# Patient Record
Sex: Female | Born: 1945 | Race: White | Hispanic: No | Marital: Married
Health system: Southern US, Community
[De-identification: ages and names within clinical notes are randomized; demographics above are authoritative.]

## PROBLEM LIST (undated history)

## (undated) DIAGNOSIS — G40909 Epilepsy, unspecified, not intractable, without status epilepticus: Secondary | ICD-10-CM

---

## 1998-01-03 ENCOUNTER — Ambulatory Visit (HOSPITAL_COMMUNITY): Admission: RE | Admit: 1998-01-03 | Discharge: 1998-01-03 | Payer: Self-pay | Admitting: Family Medicine

## 1998-01-11 ENCOUNTER — Other Ambulatory Visit: Admission: RE | Admit: 1998-01-11 | Discharge: 1998-01-11 | Payer: Self-pay | Admitting: Family Medicine

## 1999-02-04 ENCOUNTER — Other Ambulatory Visit: Admission: RE | Admit: 1999-02-04 | Discharge: 1999-02-04 | Payer: Self-pay | Admitting: Family Medicine

## 1999-02-08 ENCOUNTER — Encounter: Payer: Self-pay | Admitting: Family Medicine

## 1999-02-08 ENCOUNTER — Ambulatory Visit (HOSPITAL_COMMUNITY): Admission: RE | Admit: 1999-02-08 | Discharge: 1999-02-08 | Payer: Self-pay | Admitting: Family Medicine

## 1999-02-11 ENCOUNTER — Ambulatory Visit (HOSPITAL_COMMUNITY): Admission: RE | Admit: 1999-02-11 | Discharge: 1999-02-11 | Payer: Self-pay | Admitting: Family Medicine

## 1999-03-06 ENCOUNTER — Ambulatory Visit (HOSPITAL_COMMUNITY): Admission: RE | Admit: 1999-03-06 | Discharge: 1999-03-06 | Payer: Self-pay | Admitting: Family Medicine

## 2000-01-29 ENCOUNTER — Encounter: Admission: RE | Admit: 2000-01-29 | Discharge: 2000-01-29 | Payer: Self-pay | Admitting: Family Medicine

## 2000-01-29 ENCOUNTER — Encounter: Payer: Self-pay | Admitting: Family Medicine

## 2000-02-18 ENCOUNTER — Other Ambulatory Visit: Admission: RE | Admit: 2000-02-18 | Discharge: 2000-02-18 | Payer: Self-pay | Admitting: Family Medicine

## 2001-02-17 ENCOUNTER — Other Ambulatory Visit: Admission: RE | Admit: 2001-02-17 | Discharge: 2001-02-17 | Payer: Self-pay | Admitting: Family Medicine

## 2002-08-01 ENCOUNTER — Ambulatory Visit (HOSPITAL_COMMUNITY): Admission: RE | Admit: 2002-08-01 | Discharge: 2002-08-02 | Payer: Self-pay | Admitting: Ophthalmology

## 2015-01-13 DIAGNOSIS — N179 Acute kidney failure, unspecified: Secondary | ICD-10-CM | POA: Insufficient documentation

## 2015-01-13 DIAGNOSIS — E119 Type 2 diabetes mellitus without complications: Secondary | ICD-10-CM | POA: Insufficient documentation

## 2015-07-17 DIAGNOSIS — F331 Major depressive disorder, recurrent, moderate: Secondary | ICD-10-CM | POA: Insufficient documentation

## 2015-07-17 DIAGNOSIS — G309 Alzheimer's disease, unspecified: Secondary | ICD-10-CM | POA: Insufficient documentation

## 2015-07-17 DIAGNOSIS — E039 Hypothyroidism, unspecified: Secondary | ICD-10-CM | POA: Insufficient documentation

## 2015-07-17 DIAGNOSIS — M169 Osteoarthritis of hip, unspecified: Secondary | ICD-10-CM | POA: Insufficient documentation

## 2015-07-17 DIAGNOSIS — K589 Irritable bowel syndrome without diarrhea: Secondary | ICD-10-CM | POA: Insufficient documentation

## 2015-07-17 DIAGNOSIS — E1142 Type 2 diabetes mellitus with diabetic polyneuropathy: Secondary | ICD-10-CM | POA: Insufficient documentation

## 2015-07-17 DIAGNOSIS — E7800 Pure hypercholesterolemia, unspecified: Secondary | ICD-10-CM | POA: Insufficient documentation

## 2015-07-17 DIAGNOSIS — F0281 Dementia in other diseases classified elsewhere with behavioral disturbance: Secondary | ICD-10-CM | POA: Insufficient documentation

## 2015-07-17 DIAGNOSIS — I2510 Atherosclerotic heart disease of native coronary artery without angina pectoris: Secondary | ICD-10-CM | POA: Insufficient documentation

## 2015-07-17 DIAGNOSIS — N183 Chronic kidney disease, stage 3 unspecified: Secondary | ICD-10-CM | POA: Insufficient documentation

## 2015-07-17 DIAGNOSIS — K219 Gastro-esophageal reflux disease without esophagitis: Secondary | ICD-10-CM | POA: Insufficient documentation

## 2015-08-10 DIAGNOSIS — D509 Iron deficiency anemia, unspecified: Secondary | ICD-10-CM | POA: Insufficient documentation

## 2015-08-31 DIAGNOSIS — Z86010 Personal history of colonic polyps: Secondary | ICD-10-CM | POA: Insufficient documentation

## 2017-05-22 DIAGNOSIS — Z86718 Personal history of other venous thrombosis and embolism: Secondary | ICD-10-CM | POA: Insufficient documentation

## 2017-05-23 DIAGNOSIS — K449 Diaphragmatic hernia without obstruction or gangrene: Secondary | ICD-10-CM | POA: Insufficient documentation

## 2017-07-13 DIAGNOSIS — R252 Cramp and spasm: Secondary | ICD-10-CM | POA: Insufficient documentation

## 2017-10-20 DIAGNOSIS — Z961 Presence of intraocular lens: Secondary | ICD-10-CM | POA: Insufficient documentation

## 2018-04-01 DIAGNOSIS — Z789 Other specified health status: Secondary | ICD-10-CM | POA: Insufficient documentation

## 2018-04-01 DIAGNOSIS — Z7409 Other reduced mobility: Secondary | ICD-10-CM | POA: Insufficient documentation

## 2018-04-15 DIAGNOSIS — Z8673 Personal history of transient ischemic attack (TIA), and cerebral infarction without residual deficits: Secondary | ICD-10-CM | POA: Insufficient documentation

## 2018-10-21 DIAGNOSIS — H04123 Dry eye syndrome of bilateral lacrimal glands: Secondary | ICD-10-CM | POA: Insufficient documentation

## 2018-10-21 DIAGNOSIS — H43813 Vitreous degeneration, bilateral: Secondary | ICD-10-CM | POA: Insufficient documentation

## 2019-08-08 DIAGNOSIS — G459 Transient cerebral ischemic attack, unspecified: Secondary | ICD-10-CM | POA: Insufficient documentation

## 2019-08-11 DIAGNOSIS — G40909 Epilepsy, unspecified, not intractable, without status epilepticus: Secondary | ICD-10-CM | POA: Insufficient documentation

## 2020-08-14 DIAGNOSIS — L03031 Cellulitis of right toe: Secondary | ICD-10-CM | POA: Insufficient documentation

## 2020-09-14 DIAGNOSIS — L97529 Non-pressure chronic ulcer of other part of left foot with unspecified severity: Secondary | ICD-10-CM | POA: Insufficient documentation

## 2020-09-18 DIAGNOSIS — Z89412 Acquired absence of left great toe: Secondary | ICD-10-CM | POA: Insufficient documentation

## 2020-10-11 ENCOUNTER — Encounter (HOSPITAL_COMMUNITY): Payer: Self-pay

## 2020-10-11 ENCOUNTER — Emergency Department (HOSPITAL_COMMUNITY)
Admission: EM | Admit: 2020-10-11 | Discharge: 2020-10-11 | Disposition: A | Discharge status: Home / Self Care | Payer: Medicare PPO | Attending: Emergency Medicine | Admitting: Emergency Medicine

## 2020-10-11 ENCOUNTER — Other Ambulatory Visit: Payer: Self-pay

## 2020-10-11 DIAGNOSIS — R112 Nausea with vomiting, unspecified: Secondary | ICD-10-CM | POA: Diagnosis not present

## 2020-10-11 DIAGNOSIS — R197 Diarrhea, unspecified: Secondary | ICD-10-CM | POA: Diagnosis not present

## 2020-10-11 DIAGNOSIS — R531 Weakness: Secondary | ICD-10-CM | POA: Diagnosis not present

## 2020-10-11 DIAGNOSIS — Z7982 Long term (current) use of aspirin: Secondary | ICD-10-CM | POA: Insufficient documentation

## 2020-10-11 DIAGNOSIS — Z7901 Long term (current) use of anticoagulants: Secondary | ICD-10-CM | POA: Insufficient documentation

## 2020-10-11 DIAGNOSIS — F0391 Unspecified dementia with behavioral disturbance: Secondary | ICD-10-CM | POA: Diagnosis not present

## 2020-10-11 DIAGNOSIS — E1142 Type 2 diabetes mellitus with diabetic polyneuropathy: Secondary | ICD-10-CM | POA: Insufficient documentation

## 2020-10-11 DIAGNOSIS — N183 Chronic kidney disease, stage 3 unspecified: Secondary | ICD-10-CM | POA: Diagnosis not present

## 2020-10-11 LAB — BASIC METABOLIC PANEL
Anion gap: 9 (ref 5–15)
BUN: 14 mg/dL (ref 8–23)
CO2: 27 mmol/L (ref 22–32)
Calcium: 8.8 mg/dL — ABNORMAL LOW (ref 8.9–10.3)
Chloride: 107 mmol/L (ref 98–111)
Creatinine, Ser: 1.16 mg/dL — ABNORMAL HIGH (ref 0.44–1.00)
GFR, Estimated: 49 mL/min — ABNORMAL LOW (ref >60)
Glucose, Bld: 119 mg/dL — ABNORMAL HIGH (ref 70–99)
Potassium: 3.9 mmol/L (ref 3.5–5.1)
Sodium: 143 mmol/L (ref 135–145)

## 2020-10-11 LAB — URINALYSIS, ROUTINE W REFLEX MICROSCOPIC
Bilirubin Urine: NEGATIVE
Glucose, UA: NEGATIVE mg/dL
Hgb urine dipstick: NEGATIVE
Ketones, ur: NEGATIVE mg/dL
Leukocytes,Ua: NEGATIVE
Nitrite: NEGATIVE
Protein, ur: NEGATIVE mg/dL
Specific Gravity, Urine: 1.021 (ref 1.005–1.030)
pH: 5 (ref 5.0–8.0)

## 2020-10-11 LAB — CBC
HCT: 41.4 % (ref 36.0–46.0)
Hemoglobin: 13 g/dL (ref 12.0–15.0)
MCH: 28.3 pg (ref 26.0–34.0)
MCHC: 31.4 g/dL (ref 30.0–36.0)
MCV: 90.2 fL (ref 80.0–100.0)
Platelets: 184 10*3/uL (ref 150–400)
RBC: 4.59 MIL/uL (ref 3.87–5.11)
RDW: 14.3 % (ref 11.5–15.5)
WBC: 8.5 10*3/uL (ref 4.0–10.5)
nRBC: 0 % (ref 0.0–0.2)

## 2020-10-11 MED ORDER — ONDANSETRON HCL 8 MG PO TABS: 8.0000 mg | ORAL_TABLET | Freq: Three times a day (TID) | ORAL | 0 refills | Status: AC | PRN

## 2020-10-11 NOTE — Discharge Instructions (Addendum)
There were no serious causes found from the illness today.  She may have norovirus but there are no other specific problems.  Treatment for this includes an antiemetic drug, she is given a prescription for Zofran.  Start with a clear liquid diet.  Gradually advance to regular foods after couple of days.  Use Tylenol if needed for fever or pain.

## 2020-10-11 NOTE — ED Notes (Signed)
Patient was given ice water for PO challenge

## 2020-10-11 NOTE — ED Notes (Signed)
Pt unable to provide stool sample at this time

## 2020-10-11 NOTE — ED Provider Notes (Signed)
[facility]-EMERGENCY DEPT Provider Note   CSN: [id number] Arrival date & time: 10/11/20  1555     History Chief Complaint  Patient presents with  . Weakness    Sydney Fox is a 75 y.o. female.  HPI She presents for evaluation of generalized weakness, with nausea, vomiting and diarrhea.  Notes from her facility, an assisted living nursing center, indicate that she had normal vitals today and they are wondering if she has norovirus or C. difficile.   She was recently evaluated by her podiatrist, 10/09/2020, to follow-up for partial left great toe amputation, for chronic osteomyelitis.  She has been on oral antibiotics within the last month.  At that time she was doing well and instructed to follow-up in 2 weeks.    History reviewed. No pertinent past medical history.  Patient Active Problem List   Diagnosis Date Noted  . TIA (transient ischemic attack) 08/08/2019  . Impaired mobility and activities of daily living 04/01/2018  . Hiatal hernia 05/23/2017  . History of adenomatous polyp of colon 08/31/2015  . Chronic kidney disease, stage 3 (HCC) 07/17/2015  . Alzheimer's dementia with behavioral disturbance (HCC) 07/17/2015  . Diabetic polyneuropathy (HCC) 07/17/2015  . Diabetes mellitus (HCC) 01/13/2015    History reviewed. No pertinent surgical history.   OB History   No obstetric history on file.     History reviewed. No pertinent family history.     Home Medications Prior to Admission medications   Medication Sig Start Date End Date Taking? Authorizing Provider  apixaban (ELIQUIS) 5 MG TABS tablet Take by mouth. 12/09/19  Yes [provider]  levETIRAcetam (KEPPRA) 500 MG tablet Take by mouth. 04/23/20  Yes [provider]  levothyroxine (SYNTHROID) 100 MCG tablet Take by mouth. 12/09/19 03/09/29 Yes [provider]  memantine (NAMENDA) 10 MG tablet Take by mouth. 10/09/20  Yes [provider]  ondansetron  (ZOFRAN) 8 MG tablet Take 1 tablet (8 mg total) by mouth every 8 (eight) hours as needed for nausea or vomiting. 10/11/20  Yes Mancel Bale, MD  pregabalin (LYRICA) 50 MG capsule Take 1 capsule by mouth 3 (three) times daily. 08/07/20  Yes [provider]  sertraline (ZOLOFT) 100 MG tablet Take 1 tablet by mouth daily. 09/19/20  Yes [provider]  aspirin 81 MG EC tablet Take by mouth.    [provider]  atorvastatin (LIPITOR) 10 MG tablet Take by mouth.    [provider]  gabapentin (NEURONTIN) 300 MG capsule Take by mouth.    [provider]  lisinopril (ZESTRIL) 10 MG tablet Take by mouth.    [provider]    Allergies    Patient has no allergy information on record.  Review of Systems   Review of Systems  All other systems reviewed and are negative.   Physical Exam Updated Vital Signs BP 123/85   Pulse 71   Temp 99.5 F (37.5 C) (Oral)   Resp 20   Ht 5\' 7"  (1.702 m)   Wt 59 kg   SpO2 97%   BMI 20.36 kg/m   Physical Exam Vitals and nursing note reviewed.  Constitutional:      General: She is not in acute distress.    Appearance: She is well-developed. She is not ill-appearing, toxic-appearing or diaphoretic.  HENT:     Head: Normocephalic and atraumatic.     Right Ear: External ear normal.     Left Ear: External ear normal.  Eyes:  Conjunctiva/sclera: Conjunctivae normal.     Pupils: Pupils are equal, round, and reactive to light.  Neck:     Trachea: Phonation normal.  Cardiovascular:     Rate and Rhythm: Normal rate and regular rhythm.     Heart sounds: Normal heart sounds.  Pulmonary:     Effort: Pulmonary effort is normal.     Breath sounds: Normal breath sounds.  Abdominal:     General: There is no distension.     Palpations: Abdomen is soft.     Tenderness: There is no abdominal tenderness.  Musculoskeletal:        General: Normal range of motion.     Cervical back: Normal range of motion and  neck supple.  Skin:    General: Skin is warm and dry.  Neurological:     Mental Status: She is alert and oriented to person, place, and time.     Cranial Nerves: No cranial nerve deficit.     Sensory: No sensory deficit.     Motor: No abnormal muscle tone.     Coordination: Coordination normal.  Psychiatric:        Mood and Affect: Mood normal.        Behavior: Behavior normal.     ED Results / Procedures / Treatments   Labs (all labs ordered are listed, but only abnormal results are displayed) Labs Reviewed  BASIC METABOLIC PANEL - Abnormal; Notable for the following components:      Result Value   Glucose, Bld 119 (*)    Creatinine, Ser 1.16 (*)    Calcium 8.8 (*)    GFR, Estimated 49 (*)    All other components within normal limits  URINALYSIS, ROUTINE W REFLEX MICROSCOPIC - Abnormal; Notable for the following components:   APPearance HAZY (*)    All other components within normal limits  C DIFFICILE QUICK SCREEN W PCR REFLEX  CBC    EKG None  Radiology No results found.  Procedures Procedures   Medications Ordered in ED Medications - No data to display  ED Course  I have reviewed the triage vital signs and the nursing notes.  Pertinent labs & imaging results that were available during my care of the patient were reviewed by me and considered in my medical decision making (see chart for details).    MDM Rules/Calculators/A&P                           Patient Vitals for the past 24 hrs:  BP Temp Temp src Pulse Resp SpO2 Height Weight  10/11/20 2230 123/85 -- -- 71 20 97 % -- --  10/11/20 2200 (!) 154/69 -- -- 77 19 98 % -- --  10/11/20 2130 138/63 -- -- 74 20 94 % -- --  10/11/20 2100 (!) 116/103 -- -- 73 19 97 % -- --  10/11/20 2030 (!) 142/65 -- -- 76 17 90 % -- --  10/11/20 1945 132/69 -- -- 83 (!) 23 98 % -- --  10/11/20 1835 132/68 -- -- 79 17 99 % -- --  10/11/20 1730 (!) 126/59 -- -- 79 17 98 % -- --  10/11/20 1611 124/60 99.5 F (37.5 C)  Oral 79 20 97 % -- --  10/11/20 1604 -- -- -- -- -- -- 5\' 7"  (1.702 m) 59 kg  10/11/20 1600 -- -- -- -- -- 97 % -- --    11:25 PM Reevaluation with update and discussion.  After initial assessment and treatment, an updated evaluation reveals she is comfortable.  Son is here now and states he heard that she was vomiting today.  Patient has not vomited since in the ED.  She is not had a bowel movement, or diarrhea to be sent for testing.  Findings discussed and questions answered.  Son will take her back to her facility.Mancel Bale   Medical Decision Making:  This patient is presenting for evaluation of nausea, vomiting diarrhea, which does require a range of treatment options, and is a complaint that involves a moderate risk of morbidity and mortality. The differential diagnoses include norovirus, enteritis, infectious diarrhea. I decided to review old records, and in summary [place] female lives in a nursing care facility presenting for evaluation of vomiting and diarrhea.  I  Clinical Laboratory Tests Ordered, included CBC, Metabolic panel and Urinalysis. Review indicates normal except glucose high, creatinine high, calcium low, GFR low.  C. difficile testing ordered patient did not stool while evaluated in the ED.   Critical Interventions-clinical evaluation, laboratory testing, observation reassessment  After These Interventions, the Patient was reevaluated and was found to have not stooled, or had vomiting, after period of observation of 5 hours.  CRITICAL CARE-no Performed by: Mancel Bale  Nursing Notes Reviewed/ Care Coordinated Applicable Imaging Reviewed Interpretation of Laboratory Data incorporated into ED treatment  The patient appears reasonably screened and/or stabilized for discharge and I doubt any other medical condition or other [facility] requiring further screening, evaluation, or treatment in the ED at this time prior to discharge.  Plan: Home Medications-continue  routine medication and use OTC medicines as needed; Home Treatments-gradual advance diet and activity; return here if the recommended treatment, does not improve the symptoms; Recommended follow up-PCP follow-up as needed     Final Clinical Impression(s) / ED Diagnoses Final diagnoses:  Nausea vomiting and diarrhea    Rx / DC Orders ED Discharge Orders         Ordered    ondansetron (ZOFRAN) 8 MG tablet  Every 8 hours PRN        10/11/20 2326           Mancel Bale, MD 10/16/20 1008

## 2020-10-11 NOTE — ED Triage Notes (Signed)
Pt presents to the ED via EMS for generalized weakness and n/v/d which began one day ago. Pt presents from assisted living facility. Pt is alert and oriented to person and place.

## 2020-10-11 NOTE — ED Notes (Signed)
ED tech and this nurse entered the pt's room to find the pt had climbed out of bed and was standing upright still attached to the cardiac monitor. ED tech Victorino Dike, and this nurse helped the pt get back into bed. Door of exam room open at this time. The above information was relayed to the nightshift nurse Sunny Schlein, RN.

## 2020-10-11 NOTE — ED Notes (Signed)
Dr. Effie Shy notified and aware pt's blood work and urine sent to lab as well as EKG obtained.

## 2020-10-22 DIAGNOSIS — F02818 Dementia in other diseases classified elsewhere, unspecified severity, with other behavioral disturbance: Secondary | ICD-10-CM | POA: Insufficient documentation

## 2020-12-01 ENCOUNTER — Other Ambulatory Visit: Payer: Self-pay

## 2020-12-01 ENCOUNTER — Emergency Department (HOSPITAL_COMMUNITY): Payer: Medicare PPO

## 2020-12-01 ENCOUNTER — Encounter (HOSPITAL_COMMUNITY): Payer: Self-pay

## 2020-12-01 ENCOUNTER — Emergency Department (HOSPITAL_COMMUNITY)
Admission: EM | Admit: 2020-12-01 | Discharge: 2020-12-01 | Disposition: A | Discharge status: Home / Self Care | Payer: Medicare PPO | Attending: Emergency Medicine | Admitting: Emergency Medicine

## 2020-12-01 DIAGNOSIS — E1122 Type 2 diabetes mellitus with diabetic chronic kidney disease: Secondary | ICD-10-CM | POA: Diagnosis not present

## 2020-12-01 DIAGNOSIS — N183 Chronic kidney disease, stage 3 unspecified: Secondary | ICD-10-CM | POA: Diagnosis not present

## 2020-12-01 DIAGNOSIS — Z79899 Other long term (current) drug therapy: Secondary | ICD-10-CM | POA: Insufficient documentation

## 2020-12-01 DIAGNOSIS — R531 Weakness: Secondary | ICD-10-CM

## 2020-12-01 DIAGNOSIS — E1142 Type 2 diabetes mellitus with diabetic polyneuropathy: Secondary | ICD-10-CM | POA: Diagnosis not present

## 2020-12-01 DIAGNOSIS — E860 Dehydration: Secondary | ICD-10-CM | POA: Diagnosis not present

## 2020-12-01 DIAGNOSIS — R4182 Altered mental status, unspecified: Secondary | ICD-10-CM | POA: Diagnosis not present

## 2020-12-01 DIAGNOSIS — I2510 Atherosclerotic heart disease of native coronary artery without angina pectoris: Secondary | ICD-10-CM | POA: Diagnosis not present

## 2020-12-01 DIAGNOSIS — E039 Hypothyroidism, unspecified: Secondary | ICD-10-CM | POA: Insufficient documentation

## 2020-12-01 DIAGNOSIS — Z7901 Long term (current) use of anticoagulants: Secondary | ICD-10-CM | POA: Diagnosis not present

## 2020-12-01 DIAGNOSIS — G309 Alzheimer's disease, unspecified: Secondary | ICD-10-CM | POA: Insufficient documentation

## 2020-12-01 HISTORY — DX: Epilepsy, unspecified, not intractable, without status epilepticus: G40.909

## 2020-12-01 LAB — URINALYSIS, ROUTINE W REFLEX MICROSCOPIC
Bilirubin Urine: NEGATIVE
Glucose, UA: NEGATIVE mg/dL
Ketones, ur: 20 mg/dL — AB
Nitrite: NEGATIVE
Protein, ur: NEGATIVE mg/dL
Specific Gravity, Urine: 1.018 (ref 1.005–1.030)
pH: 5 (ref 5.0–8.0)

## 2020-12-01 LAB — CBC
HCT: 41.3 % (ref 36.0–46.0)
Hemoglobin: 13 g/dL (ref 12.0–15.0)
MCH: 28.3 pg (ref 26.0–34.0)
MCHC: 31.5 g/dL (ref 30.0–36.0)
MCV: 89.8 fL (ref 80.0–100.0)
Platelets: 136 10*3/uL — ABNORMAL LOW (ref 150–400)
RBC: 4.6 MIL/uL (ref 3.87–5.11)
RDW: 14.2 % (ref 11.5–15.5)
WBC: 6.4 10*3/uL (ref 4.0–10.5)
nRBC: 0 % (ref 0.0–0.2)

## 2020-12-01 LAB — COMPREHENSIVE METABOLIC PANEL
ALT: 31 U/L (ref 0–44)
AST: 62 U/L — ABNORMAL HIGH (ref 15–41)
Albumin: 3.7 g/dL (ref 3.5–5.0)
Alkaline Phosphatase: 79 U/L (ref 38–126)
Anion gap: 12 (ref 5–15)
BUN: 17 mg/dL (ref 8–23)
CO2: 23 mmol/L (ref 22–32)
Calcium: 8.5 mg/dL — ABNORMAL LOW (ref 8.9–10.3)
Chloride: 103 mmol/L (ref 98–111)
Creatinine, Ser: 1.7 mg/dL — ABNORMAL HIGH (ref 0.44–1.00)
GFR, Estimated: 31 mL/min — ABNORMAL LOW (ref >60)
Glucose, Bld: 111 mg/dL — ABNORMAL HIGH (ref 70–99)
Potassium: 3.4 mmol/L — ABNORMAL LOW (ref 3.5–5.1)
Sodium: 138 mmol/L (ref 135–145)
Total Bilirubin: 0.8 mg/dL (ref 0.3–1.2)
Total Protein: 7 g/dL (ref 6.5–8.1)

## 2020-12-01 LAB — CBG MONITORING, ED: Glucose-Capillary: 100 mg/dL — ABNORMAL HIGH (ref 70–99)

## 2020-12-01 LAB — LACTIC ACID, PLASMA: Lactic Acid, Venous: 0.9 mmol/L (ref 0.5–1.9)

## 2020-12-01 MED ORDER — SODIUM CHLORIDE 0.9 % IV BOLUS
500.0000 mL | Freq: Once | INTRAVENOUS | Status: AC
  Administered 2020-12-01: 500 mL via INTRAVENOUS

## 2020-12-01 NOTE — Discharge Instructions (Signed)
Make sure you are drinking plenty of fluids, labs today show dehydration, we do not see signs of any infection, chest x-ray and head CT were clear as well.  Sydney Fox ambulated with walker without difficulty, eat and is feeling much better after fluids here.

## 2020-12-01 NOTE — ED Triage Notes (Signed)
Per EMS pt is from [facility] facility. Staff states AMS, baseline X2 person and place. Today only alert to person. Generalized weakness and decreased appetite for the past 2 days. 20 LAC 90/50 AFIB HR 90 O2 98 RA CBG 113

## 2020-12-01 NOTE — ED Provider Notes (Signed)
  Face-to-face evaluation   History: Patient presenting for evaluation of altered mental status.  She has a history of Alzheimer's dementia.  Diabetes, impaired mobility.  She lives in a nursing care facility.  She is is responsive, able to answer some questions about her wellbeing, but is not able to give HPI.  Her son is at the bedside with her and states that he feels like she is near her baseline at this time.  He was told from the facility, that she was weak today and had low blood pressure.  Therefore she was sent here for evaluation.   Physical exam: Alert, calm and cooperative.  No dysarthria or aphasia.  Abdomen soft and nontender to palpation, no respiratory distress.  Responsive and communicative however confused.  Medical screening examination/treatment/procedure(s) were conducted as a shared visit with non-physician practitioner(s) and myself.  I personally evaluated the patient during the encounter    Mancel Bale, MD 12/04/20 0025

## 2020-12-01 NOTE — ED Provider Notes (Signed)
[facility]-EMERGENCY DEPT Provider Note   CSN: [id number] Arrival date & time: 12/01/20  1151     History Chief Complaint  Patient presents with   Altered Mental Status   Weakness    Sydney Fox is a 75 y.o. female.  Sydney Fox is a 75 y.o. female with history of timers, diabetes, CKD, neuropathy, epilepsy, who presents to the emergency department from [facility] skilled nursing facility via EMS per staff report patient has been more confused than her baseline, and has been exhibiting generalized weakness and poor appetite for the past 2 days.  She was also noted to be mildly hypotensive at facility today.  Patient's son is also at bedside who visits with her at least once a week.  He reports currently she seems to be at her baseline mental status.  Patient does report feeling generally very weak and she has been a bit lightheaded.  No fevers reported by facility and patient reports she has not felt feverish.  Denies any chest pain or shortness of breath.  No nausea or vomiting and no abdominal pain patient reports she just has not had much appetite and has had very little to eat or drink the past few days and she has progressively felt weaker.  Denies any associated headache, visual changes, feeling as though she is going to pass out, and no focal numbness or weakness in the extremities.  Patient reports she has felt similarly when she has gotten dehydrated previously.  Also has history of frequent UTIs, denies current dysuria or urinary frequency.  The history is provided by the nursing home, the patient, the EMS personnel and a relative.      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) 09/18/2020   Diabetic ulcer of toe of left foot  associated with type 2 diabetes mellitus (HCC) 09/14/2020   Cellulitis of toe of right foot 08/14/2020   Epilepsy (HCC) 08/11/2019   TIA (transient ischemic attack) 08/08/2019   Dry eye syndrome of both eyes 10/21/2018   Posterior vitreous detachment, bilateral 10/21/2018   History of completed stroke 04/15/2018   Impaired mobility and activities of daily living 04/01/2018   Pseudophakia of both eyes 10/20/2017   Leg cramps 07/13/2017   Hiatal hernia 05/23/2017   History of DVT in adulthood 05/22/2017   History of adenomatous polyp of colon 08/31/2015   Iron deficiency anemia 08/10/2015   Chronic kidney disease, stage 3 (HCC) 07/17/2015   Alzheimer's dementia with behavioral disturbance (HCC) 07/17/2015   Diabetic polyneuropathy (HCC) 07/17/2015   Acquired hypothyroidism 07/17/2015   CAD (coronary artery disease) 07/17/2015   Gastroesophageal reflux disease without esophagitis 07/17/2015   Hypercholesteremia 07/17/2015   IBS (irritable bowel syndrome) 07/17/2015   Moderate episode of recurrent major depressive disorder (HCC) 07/17/2015   Osteoarthritis of hip 07/17/2015   Diabetes mellitus (HCC) 01/13/2015   Acute renal failure (ARF) (HCC) 01/13/2015    History reviewed. No pertinent surgical history.   OB History   No obstetric history on file.     History reviewed. No pertinent family history.  Social History   Tobacco Use   Smoking status: Unknown    Home Medications Prior to Admission medications   Medication Sig Start Date End Date Taking? Authorizing Provider  acetaminophen (TYLENOL) 500 MG tablet Take 500 mg by mouth every 6 (six) hours as needed (fever 99.5-101 F, minor headache, minor discomfort).   Yes [provider]  alum & mag hydroxide-simeth (MAALOX/MYLANTA) 200-200-20 MG/5ML suspension Take 30 mLs by mouth every 6 (six) hours as needed for indigestion or heartburn.   Yes [provider]  apixaban (ELIQUIS) 5 MG TABS tablet Take 5 mg by  mouth 2 (two) times daily. 12/09/19  Yes [provider]  atorvastatin (LIPITOR) 40 MG tablet Take 40 mg by mouth at bedtime.   Yes [provider]  donepezil (ARICEPT) 10 MG tablet Take 10 mg by mouth 2 (two) times daily.   Yes [provider]  guaifenesin (ROBITUSSIN) 100 MG/5ML syrup Take 200 mg by mouth every 6 (six) hours as needed for cough.   Yes [provider]  levETIRAcetam (KEPPRA) 500 MG tablet Take 500 mg by mouth 2 (two) times daily. 04/23/20  Yes [provider]  levothyroxine (SYNTHROID) 100 MCG tablet Take 100 mcg by mouth daily at 6 (six) AM. 12/09/19 03/09/29 Yes [provider]  loperamide (IMODIUM) 2 MG capsule Take 2 mg by mouth as needed for diarrhea or loose stools (max 8 doses in 24 hours).   Yes [provider]  magnesium hydroxide (MILK OF MAGNESIA) 400 MG/5ML suspension Take 30 mLs by mouth at bedtime as needed (constipation).   Yes [provider]  memantine (NAMENDA) 10 MG tablet Take 10 mg by mouth 2 (two) times daily. 10/09/20  Yes [provider]  montelukast (SINGULAIR) 5 MG chewable tablet Chew 5 mg by mouth 2 (two) times daily.   Yes [provider]  Neomycin-Bacitracin-Polymyxin (TRIPLE ANTIBIOTIC) [phone] OINT Apply 1 application topically daily as needed (minor skin tears or abrasians).   Yes [provider]  ondansetron (ZOFRAN) 8 MG tablet Take 1 tablet (8 mg total) by mouth every 8 (eight) hours as needed for nausea or vomiting. 10/11/20  Yes Mancel Bale, MD  pantoprazole (PROTONIX) 40 MG tablet Take 40 mg by mouth daily at 6 (six) AM.   Yes [provider]  pregabalin (LYRICA) 50 MG capsule Take 50 mg by mouth 3 (three) times daily. 08/07/20  Yes [provider]  sertraline (ZOLOFT) 100 MG tablet Take 1 tablet by mouth every morning. 09/19/20  Yes [provider]    Allergies    Codeine, Penicillins, and Tizanidine  Review of  Systems   Review of Systems  Constitutional:  Positive for fatigue. Negative for chills and fever.  HENT: Negative.    Respiratory:  Negative for cough and shortness of breath.   Cardiovascular:  Negative for chest pain.  Gastrointestinal:  Negative for abdominal pain, diarrhea, nausea and vomiting.  Genitourinary:  Negative for dysuria and frequency.  Musculoskeletal:  Negative for arthralgias and myalgias.  Skin:  Negative for color change.  Neurological:  Positive for weakness (Generalized). Negative for dizziness, syncope and light-headedness.  All other systems reviewed and are negative.  Physical Exam Updated Vital Signs BP (!) 99/51   Pulse (!) 53   Temp 97.6 F (36.4 C) (Oral)   Resp 19   SpO2 96%   Physical Exam Vitals and nursing note reviewed.  Constitutional:      General: She is not in acute distress.    Appearance: Normal appearance. She is well-developed. She is not diaphoretic.     Comments: Alert, pleasantly demented, chronically ill-appearing but in no acute distress  HENT:  Head: Normocephalic and atraumatic.     Mouth/Throat:     Comments: Mucous membranes slightly dry Eyes:     General:        Right eye: No discharge.        Left eye: No discharge.  Cardiovascular:     Rate and Rhythm: Normal rate and regular rhythm.     Pulses: Normal pulses.     Heart sounds: Normal heart sounds. No murmur heard.   No friction rub. No gallop.  Pulmonary:     Effort: Pulmonary effort is normal. No respiratory distress.     Breath sounds: Normal breath sounds. No wheezing or rales.     Comments: Respirations equal and unlabored, patient able to speak in full sentences, lungs clear to auscultation bilaterally  Abdominal:     General: Bowel sounds are normal. There is no distension.     Palpations: Abdomen is soft. There is no mass.     Tenderness: There is no abdominal tenderness. There is no guarding.     Comments: Abdomen soft, nondistended, nontender to  palpation in all quadrants without guarding or peritoneal signs  Musculoskeletal:        General: No deformity.     Cervical back: Neck supple.     Right lower leg: No edema.     Left lower leg: No edema.  Skin:    General: Skin is warm and dry.     Capillary Refill: Capillary refill takes less than 2 seconds.  Neurological:     Mental Status: She is alert and oriented to person, place, and time.     Coordination: Coordination normal.     Comments: Speech is clear, able to follow commands CN III-XII intact Normal strength in upper and lower extremities bilaterally including dorsiflexion and plantar flexion, strong and equal grip strength Sensation normal to light and sharp touch Moves extremities without ataxia, coordination intact  Psychiatric:        Mood and Affect: Mood normal.        Behavior: Behavior normal.    ED Results / Procedures / Treatments   Labs (all labs ordered are listed, but only abnormal results are displayed) Labs Reviewed  URINE CULTURE - Abnormal; Notable for the following components:      Result Value   Culture   (*)    Value: 50,000 COLONIES/mL ENTEROCOCCUS FAECALIS SUSCEPTIBILITIES TO FOLLOW Performed at [facility], [address], [place], [place] [address]    All other components within normal limits  BLOOD CULTURE ID PANEL (REFLEXED) - BCID2 - Abnormal; Notable for the following components:   Staphylococcus species DETECTED (*)    All other components within normal limits  COMPREHENSIVE METABOLIC PANEL - Abnormal; Notable for the following components:   Potassium 3.4 (*)    Glucose, Bld 111 (*)    Creatinine, Ser 1.70 (*)    Calcium 8.5 (*)    AST 62 (*)    GFR, Estimated 31 (*)    All other components within normal limits  CBC - Abnormal; Notable for the following components:   Platelets 136 (*)    All other components within normal limits  URINALYSIS, ROUTINE W REFLEX MICROSCOPIC - Abnormal; Notable for the following components:    Hgb urine dipstick SMALL (*)    Ketones, ur 20 (*)    Leukocytes,Ua TRACE (*)    Bacteria, UA RARE (*)    All other components within normal limits  CBG MONITORING, ED - Abnormal;  Notable for the following components:   Glucose-Capillary 100 (*)    All other components within normal limits  CULTURE, BLOOD (ROUTINE X 2)  CULTURE, BLOOD (ROUTINE X 2)  LACTIC ACID, PLASMA    EKG EKG Interpretation  Date/Time:  Saturday December 01 2020 15:33:33 EDT Ventricular Rate:  62 PR Interval:  158 QRS Duration: 202 QT Interval:  518 QTC Calculation: 482 R Axis:   25 Text Interpretation: Sinus rhythm Nonspecific intraventricular conduction delay Probable anteroseptal infarct, recent No old tracing to compare Confirmed by Lorre Nick ([id number]) on 12/02/2020 10:39:33 AM  Radiology DG Chest 2 View  Result Date: 12/01/2020 CLINICAL DATA:  Altered mental status. EXAM: CHEST - 2 VIEW COMPARISON:  October 02, 2020 FINDINGS: Cardiomediastinal silhouette is normal. Mediastinal contours appear intact. There is no evidence of focal airspace consolidation, pleural effusion or pneumothorax. Osseous structures are without acute abnormality. Soft tissues are grossly normal. IMPRESSION: No active cardiopulmonary disease. Electronically Signed   By: Ted Mcalpine M.D.   On: 12/01/2020 16:57   CT Head Wo Contrast  Result Date: 12/01/2020 CLINICAL DATA:  Cerebral hemorrhage suspected. Additional provided: Generalized weakness and decreased appetite for 2 days. EXAM: CT HEAD WITHOUT CONTRAST TECHNIQUE: Contiguous axial images were obtained from the base of the skull through the vertex without intravenous contrast. COMPARISON:  Brain MRI 08/09/2019. head CT 03/26/2019. FINDINGS: Brain: Mild generalized cerebral atrophy. Moderate-sized chronic cortical/subcortical right MCA territory infarct within the posterior right frontal lobe, right parietal lobe and right temporal stem. Background mild patchy and ill-defined  hypoattenuation within cerebral white matter, nonspecific but compatible with chronic small vessel ischemic disease. There is no acute intracranial hemorrhage. No acute demarcated cortical infarct. No extra-axial fluid collection. No evidence of intracranial mass. No midline shift. Vascular: No hyperdense vessel.  Atherosclerotic calcifications. Skull: Normal. Negative for fracture or focal lesion. Sinuses/Orbits: Visualized orbits show no acute finding. Small mucous retention cysts within the partially imaged right maxillary sinus. IMPRESSION: No evidence of acute intracranial abnormality. Redemonstrated moderate-sized chronic cortical/subcortical right MCA territory infarct. Stable background mild generalized cerebral atrophy and chronic small vessel ischemic disease. Electronically Signed   By: Jackey Loge DO   On: 12/01/2020 15:34    Procedures Procedures   Medications Ordered in ED Medications  sodium chloride 0.9 % bolus 500 mL (0 mLs Intravenous Stopped 12/01/20 1530)  sodium chloride 0.9 % bolus 500 mL (0 mLs Intravenous Stopped 12/01/20 1704)    ED Course  I have reviewed the triage vital signs and the nursing notes.  Pertinent labs & imaging results that were available during my care of the patient were reviewed by me and considered in my medical decision making (see chart for details).    MDM Rules/Calculators/A&P                          75 y.o. female presents to the ED with complaints of weakness in the worsening confusion from baseline dementia borderline hypotension, this involves an extensive number of treatment options, and is a complaint that carries with it a high risk of complications and morbidity.  The differential diagnosis includes sepsis, UTI, pneumonia, dehydration, electrolyte derangement, AKI, worsening dementia  On arrival pt is nontoxic, vitals significant for borderline hypotension systolic with EMS on arrival in the 90s, but improved here. Exam significant for  signs of dehydration, patient seems to be at baseline mental status with no focal neurologic deficits, no signs of injury or trauma  Additional  history obtained from EMS, son at bedside. Previous records obtained and reviewed via EMR  I ordered IV fluid bolus for hydration  Lab Tests:  I Ordered, reviewed, and interpreted labs, which included:  CBC: No leukocytosis, normal hemoglobin CMP: Potassium 3.4, glucose 111, creatinine 1.7, increased from baseline of 1.1, likely in the setting of dehydration, LFTs unremarkable Lactic acid: WNL UA: Trace leukocytes with rare bacteria present, no other signs of infection, will send for culture but will not treat at this time. Blood cultures pending  Imaging Studies ordered:  I ordered imaging studies which included CXR, CT head, I independently visualized and interpreted imaging which showed no acute abnormalities  ED Course:   After 2 IV fluid boluses patient feeling much better, ate a meal here in the ED and was able to ambulate at her baseline with walker without dizziness, lightheadedness or hypotension.  Blood pressures have improved significantly here.  Feel patient's symptoms are likely due to dehydration, no signs of infection on work-up today.  Encouraged her to increase p.o. intake, but feel she is stable for discharge back to skilled nursing facility.  Patient and son are in agreement.   Portions of this note were generated with Scientist, clinical (histocompatibility and immunogenetics)Dragon dictation software. Dictation errors may occur despite best attempts at proofreading.  Final Clinical Impression(s) / ED Diagnoses Final diagnoses:  Weakness  Dehydration    Rx / DC Orders ED Discharge Orders     None        Dartha LodgeFord, Conlin Brahm N, [place] JerseyPA-C 12/03/20 1457    Mancel BaleWentz, [place], MD 12/04/20 0025

## 2020-12-01 NOTE — ED Notes (Signed)
Patient did walk up and down the hall with the Nurse Tech this evening and she did well although she was tried but that is normal for her. The PA wanted to know and the Nurse Tech did confirm.

## 2020-12-03 LAB — BLOOD CULTURE ID PANEL (REFLEXED) - BCID2

## 2020-12-04 LAB — URINE CULTURE: Culture: 50000 — AB

## 2020-12-05 ENCOUNTER — Telehealth: Payer: Self-pay | Admitting: Emergency Medicine

## 2020-12-05 LAB — CULTURE, BLOOD (ROUTINE X 2)

## 2020-12-05 NOTE — Telephone Encounter (Signed)
Post ED Visit - Positive Culture Follow-up  Culture report reviewed by antimicrobial stewardship pharmacist: [facility] []  Nathan Batchelder, Pharm.D. []  , Pharm.D., BCPS AQ-ID []  , Pharm.D., BCPS []  Celedonio Miyamoto, Pharm.D., BCPS []  [place], Garvin Fila.D., BCPS, AAHIVP []  , Pharm.D., BCPS, AAHIVP []  Georgina Pillion, PharmD, BCPS []  , PharmD, BCPS []  [place], PharmD, BCPS []  [address], PharmD []  , PharmD, BCPS []  Estella Husk, PharmD  Pharmacy Team []  Lysle Pearl, PharmD []  , PharmD []  Phillips Climes, PharmD []  , Rph []  Agapito Games) , PharmD []  Verlan Friends, PharmD []  , PharmD []  Mervyn Gay, PharmD []  , PharmD []  Vinnie Level, PharmD []  [facility], PharmD []  , PharmD []  Len Childs, PharmD   Positive urine culture Treated with none, asymptomatic,no further patient follow-up is required at this time.  12/05/2020, 1:01 PM

## 2020-12-05 NOTE — Progress Notes (Signed)
ED Antimicrobial Stewardship Positive Culture Follow Up   Sydney Fox is an 75 y.o. female who presented to [facility] on 12/01/2020 with a chief complaint of generalized weakness and altered mental status  Chief Complaint  Patient presents with   Altered Mental Status   Weakness    Recent Results (from the past 720 hour(s))  Urine culture     Status: Abnormal   Collection Time: 12/01/20  3:24 PM   Specimen: Urine, Random  Result Value Ref Range Status   Specimen Description   Final    URINE, RANDOM Performed at [facility], [address] [place], [place] [address]    Special Requests   Final    NONE Performed at [facility], [address], [place], [place] [address]    Culture 50,000 COLONIES/mL ENTEROCOCCUS FAECALIS (A)  Final   Report Status 12/04/2020 FINAL  Final   Organism ID, Bacteria ENTEROCOCCUS FAECALIS (A)  Final      Susceptibility   Enterococcus faecalis - MIC*    AMPICILLIN <=2 SENSITIVE Sensitive     NITROFURANTOIN <=16 SENSITIVE Sensitive     VANCOMYCIN <=0.5 SENSITIVE Sensitive     * 50,000 COLONIES/mL ENTEROCOCCUS FAECALIS  Culture, blood (routine x 2)     Status: Abnormal (Preliminary result)   Collection Time: 12/01/20  3:24 PM   Specimen: BLOOD  Result Value Ref Range Status   Specimen Description   Final    BLOOD LEFT ARM Performed at [facility], [address], [place], [place] [address]    Special Requests   Final    BOTTLES DRAWN AEROBIC AND ANAEROBIC Blood Culture results may not be optimal due to an inadequate volume of blood received in culture bottles Performed at [facility] [address], [place], [place] [address]    Culture  Setup Time   Final    GRAM POSITIVE COCCI IN CLUSTERS AEROBIC BOTTLE ONLY CRITICAL RESULT CALLED TO, READ BACK BY AND VERIFIED WITH: RN Foye Clock [id number] FCP Performed at [facility], [address], [place],  [place] [address]    Culture STAPHYLOCOCCUS HOMINIS (A)  Final   Report Status PENDING  Incomplete  Blood Culture ID Panel (Reflexed)     Status: Abnormal   Collection Time: 12/01/20  3:24 PM  Result Value Ref Range Status   Enterococcus faecalis NOT DETECTED NOT DETECTED Final   Enterococcus Faecium NOT DETECTED NOT DETECTED Final   Listeria monocytogenes NOT DETECTED NOT DETECTED Final   Staphylococcus species DETECTED (A) NOT DETECTED Final    Comment: CRITICAL RESULT CALLED TO, READ BACK BY AND VERIFIED WITH: RN SAM C. 1112 [id number] FCP    Staphylococcus aureus (BCID) NOT DETECTED NOT DETECTED Final   Staphylococcus epidermidis NOT DETECTED NOT DETECTED Final   Staphylococcus lugdunensis NOT DETECTED NOT DETECTED Final   Streptococcus species NOT DETECTED NOT DETECTED Final   Streptococcus agalactiae NOT DETECTED NOT DETECTED Final   Streptococcus pneumoniae NOT DETECTED NOT DETECTED Final   Streptococcus pyogenes NOT DETECTED NOT DETECTED Final   A.calcoaceticus-baumannii NOT DETECTED NOT DETECTED Final   Bacteroides fragilis NOT DETECTED NOT DETECTED Final   Enterobacterales NOT DETECTED NOT DETECTED Final   Enterobacter cloacae complex NOT DETECTED NOT DETECTED Final   Escherichia coli NOT DETECTED NOT DETECTED Final   Klebsiella aerogenes NOT DETECTED NOT DETECTED Final   Klebsiella oxytoca NOT DETECTED NOT DETECTED Final   Klebsiella pneumoniae NOT DETECTED NOT DETECTED  Final   Proteus species NOT DETECTED NOT DETECTED Final   Salmonella species NOT DETECTED NOT DETECTED Final   Serratia marcescens NOT DETECTED NOT DETECTED Final   Haemophilus influenzae NOT DETECTED NOT DETECTED Final   Neisseria meningitidis NOT DETECTED NOT DETECTED Final   Pseudomonas aeruginosa NOT DETECTED NOT DETECTED Final   Stenotrophomonas maltophilia NOT DETECTED NOT DETECTED Final   Candida albicans NOT DETECTED NOT DETECTED Final   Candida auris NOT DETECTED NOT DETECTED Final   Candida glabrata NOT  DETECTED NOT DETECTED Final   Candida krusei NOT DETECTED NOT DETECTED Final   Candida parapsilosis NOT DETECTED NOT DETECTED Final   Candida tropicalis NOT DETECTED NOT DETECTED Final   Cryptococcus neoformans/gattii NOT DETECTED NOT DETECTED Final    Comment: Performed at [facility], [address] [place], [place] [address]  Culture, blood (routine x 2)     Status: None (Preliminary result)   Collection Time: 12/01/20  3:29 PM   Specimen: BLOOD  Result Value Ref Range Status   Specimen Description   Final    BLOOD LEFT ANTECUBITAL Performed at [facility], [address] [place], [place] [address]    Special Requests   Final    BOTTLES DRAWN AEROBIC AND ANAEROBIC Blood Culture results may not be optimal due to an inadequate volume of blood received in culture bottles Performed at [facility], [address], [place], [place] [address]    Culture   Final    NO GROWTH 3 DAYS Performed at [facility], [address], [place], [place] [address]    Report Status PENDING  Incomplete   57 YOF presented to the ED from SNF for altered mental status and generalized weakness. She reported feeling weak, lightheaded, and having a decreased appetite x 2 days. She has a history of frequent UTI's though denied any current dysuria, and urinary frequency. Her urinalysis also showed rare bacteria and 0-5 WBC and her culture grew 50,000 colonies of Enterococcus faecalis. Culture growth and lack of urinary symptoms consistent with asymptomatic bacteriuria.  After receiving 2 IV fluid boluses, patient reported that she felt much better and was able to eat a meal and ambulate. No treatment necessary at this time.  ED Provider: Kristine Royal, MD   Johnston Ebbs, Student Pharmacist 12/05/2020, 8:31 AM

## 2020-12-06 ENCOUNTER — Telehealth: Payer: Self-pay

## 2020-12-06 LAB — CULTURE, BLOOD (ROUTINE X 2): Culture: NO GROWTH

## 2020-12-06 NOTE — Telephone Encounter (Signed)
Post ED Visit - Positive Culture Follow-up  Culture report reviewed by antimicrobial stewardship pharmacist: [facility] []  , Pharm.D. []  Enzo Bi, Pharm.D., BCPS AQ-ID []  , Pharm.D., BCPS []  Celedonio Miyamoto, Pharm.D., BCPS []  [place], Garvin Fila.D., BCPS, AAHIVP []  , Pharm.D., BCPS, AAHIVP []  Georgina Pillion, PharmD, BCPS []  , PharmD, BCPS []  [place], PharmD, BCPS []  [place], PharmD []  , PharmD, BCPS []  Estella Husk, PharmD  Pharmacy Team []  Lysle Pearl, PharmD []  , PharmD []  Phillips Climes, PharmD []  , Rph []  Agapito Games) , PharmD []  Verlan Friends, PharmD []  , PharmD []  Mervyn Gay, PharmD []  , PharmD []  Vinnie Level, PharmD []  [facility], PharmD []  , PharmD [x]  Len Childs, Student PharmD  Probable contamination of blood culture No treatment necessary and no further patient follow-up is required at this time.  12/06/2020, 10:10 AM

## 2021-04-16 DEATH — deceased

## 2022-05-12 IMAGING — CT CT HEAD W/O CM
3 series · 15 of 47 positions shown, 18 images · non-contrast
Comparison: Brain MRI 08/09/2019. head CT 03/26/2019.

CLINICAL DATA: Cerebral hemorrhage suspected. Additional provided:
Generalized weakness and decreased appetite for 2 days.

EXAM:
CT HEAD WITHOUT CONTRAST
TECHNIQUE: Contiguous axial images were obtained from the base of the skull
through the vertex without intravenous contrast.

[Series 2: head wo · axial · 0.47mm/px · z∈[-48,+77]mm · 9 of 30 slices shown, 12 images]
[im 3/30  brain]
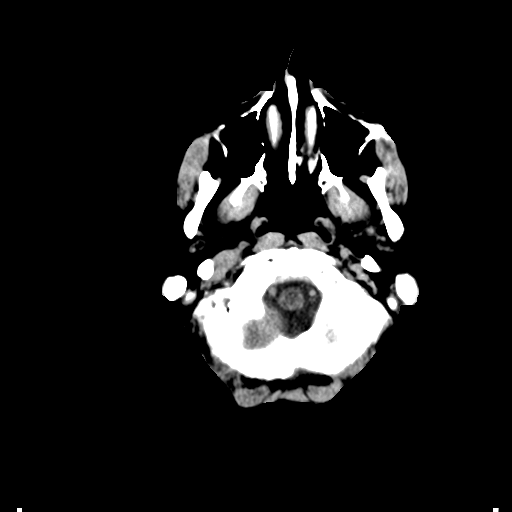
[im 3/30  bone]
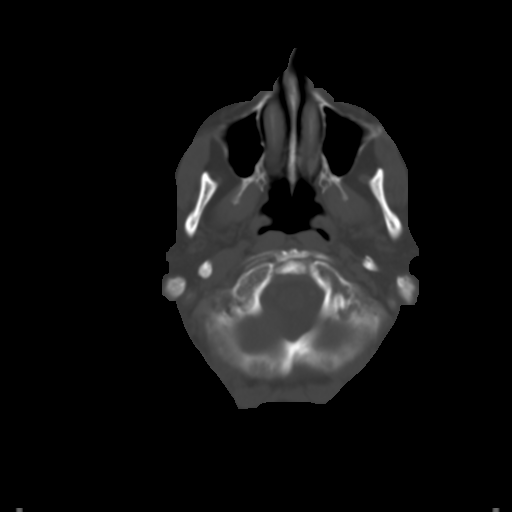
[im 6/30  brain]
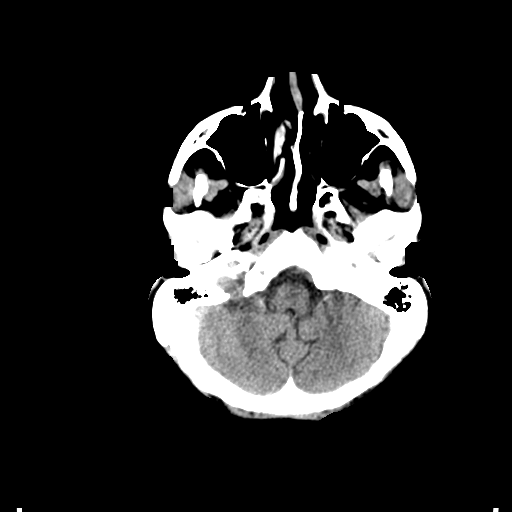
[im 9/30  brain]
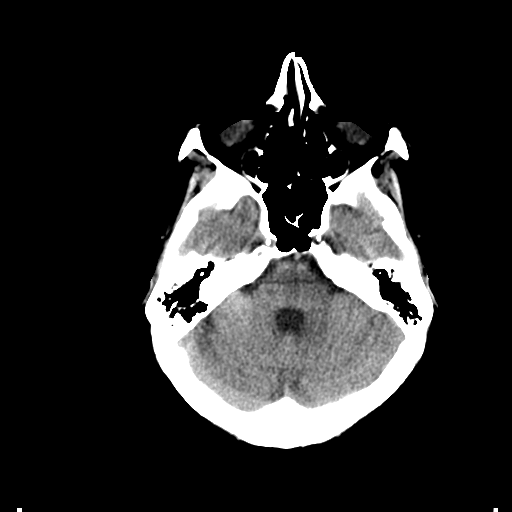
[im 12/30  brain]
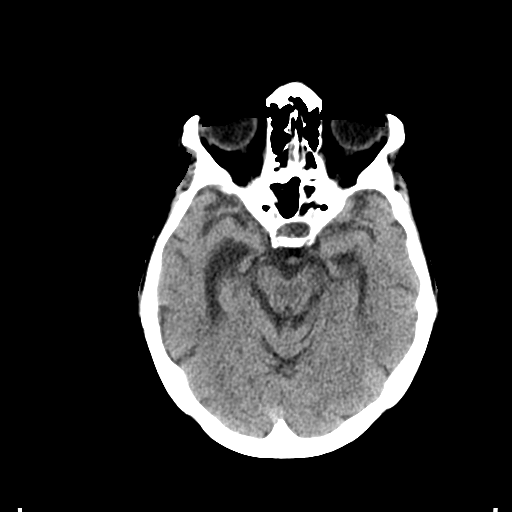
[im 16/30  brain]
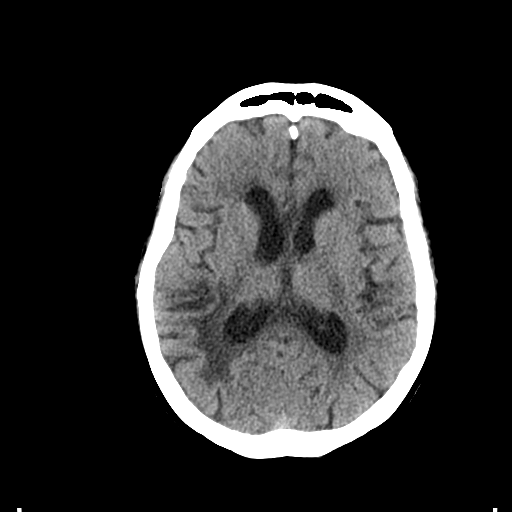
[im 16/30  bone]
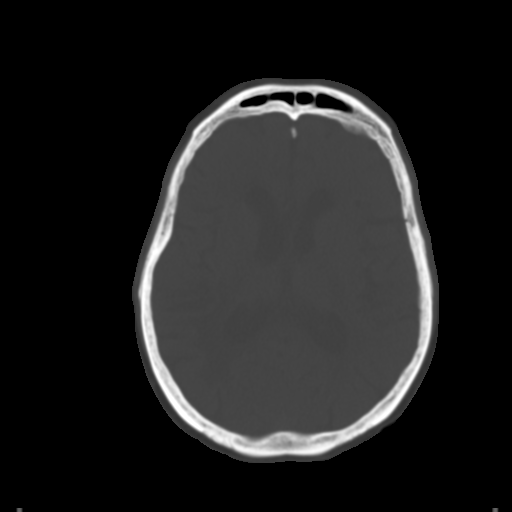
[im 19/30  brain]
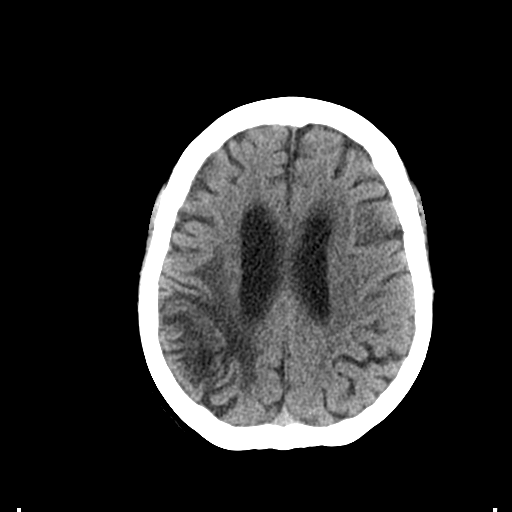
[im 22/30  brain]
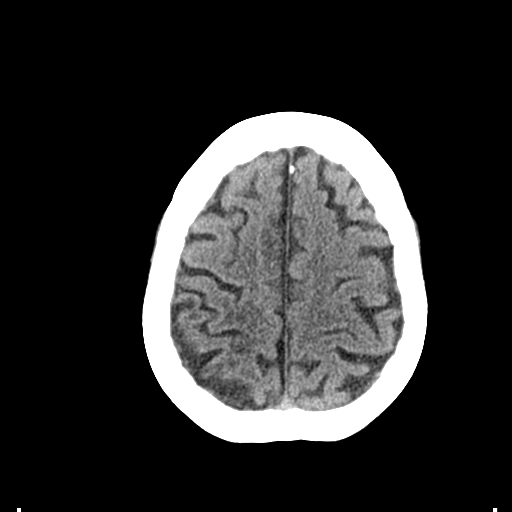
[im 25/30  brain]
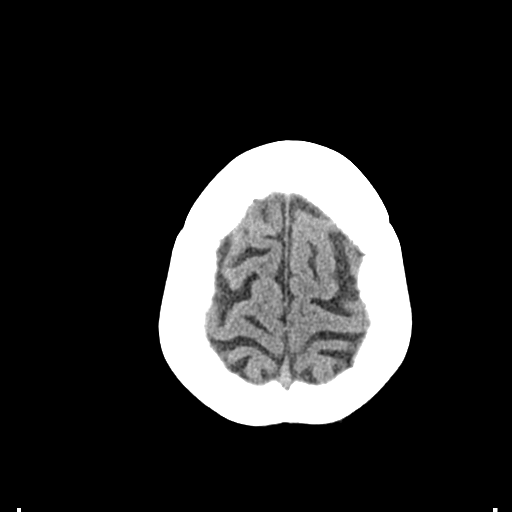
[im 28/30  brain]
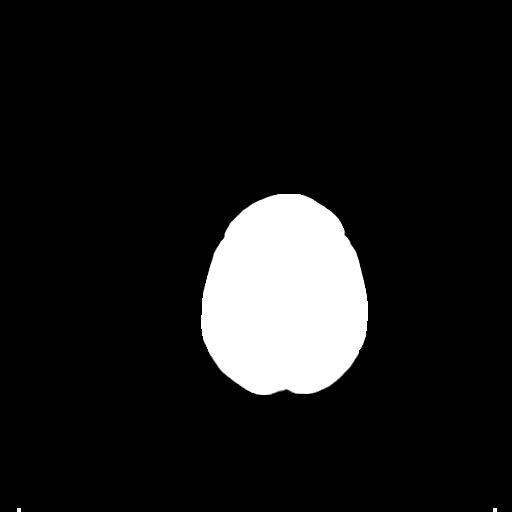
[im 28/30  bone]
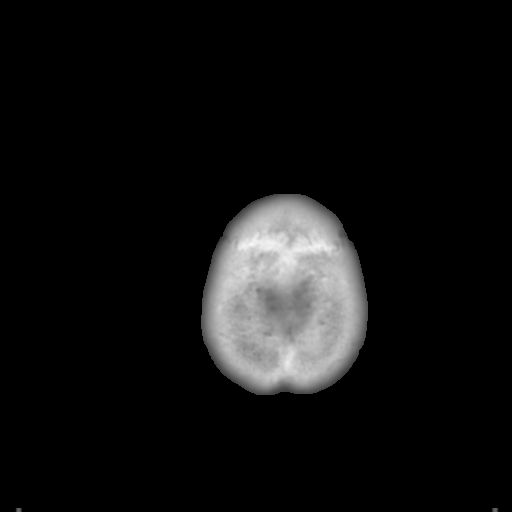

[Series 4: coronal soft tissue · coronal · 0.29mm/px · 3 of 64 slices shown]
[im 22/64  brain]
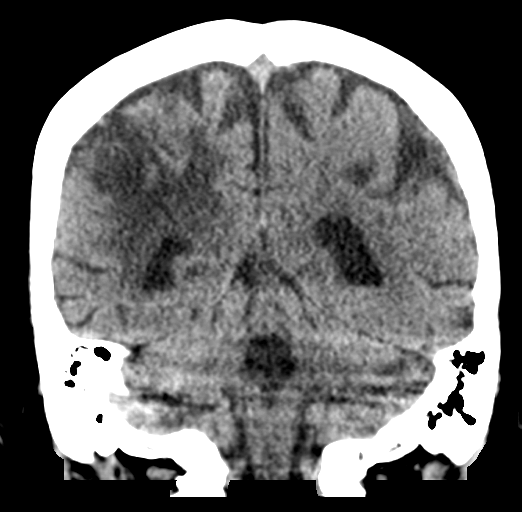
[im 29/64  brain]
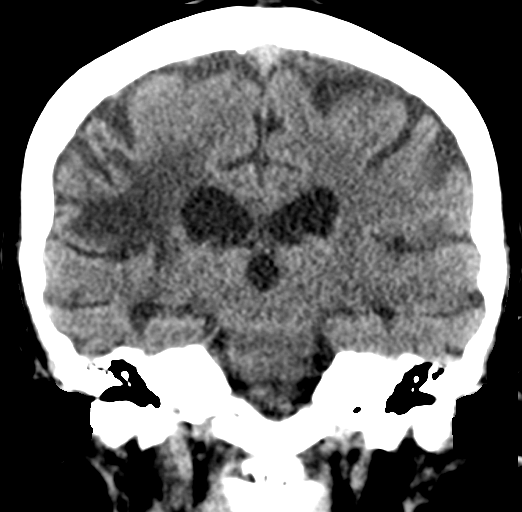
[im 36/64  brain]
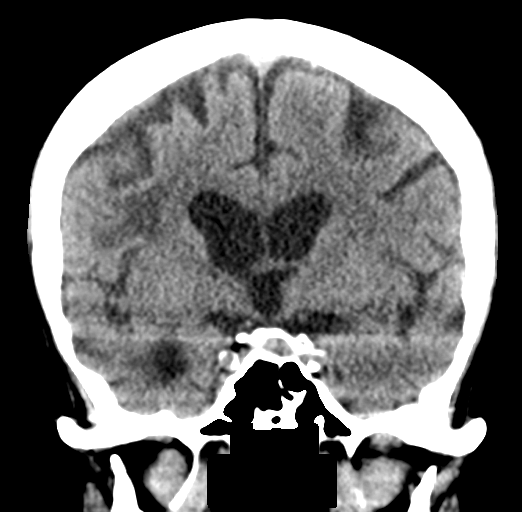

[Series 5: sagittal soft tissue · sagittal · 0.29mm/px · 3 of 46 slices shown]
[im 16/46  brain]
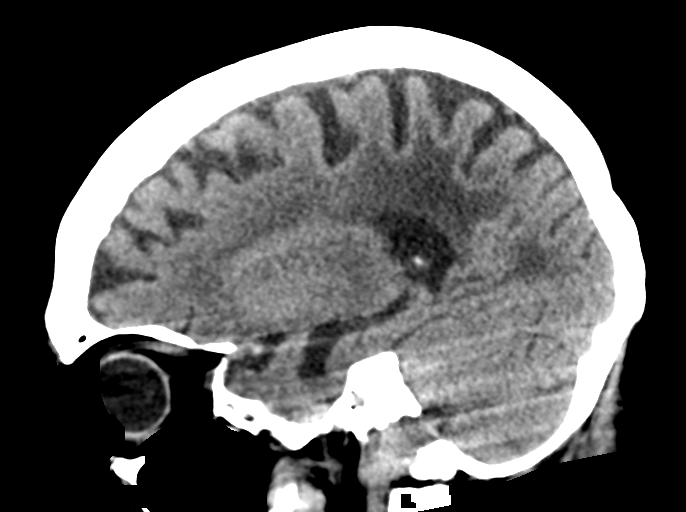
[im 23/46  brain]
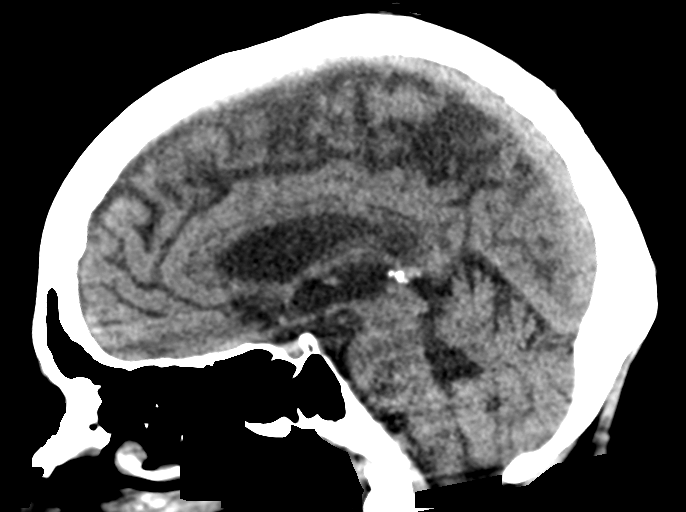
[im 31/46  brain]
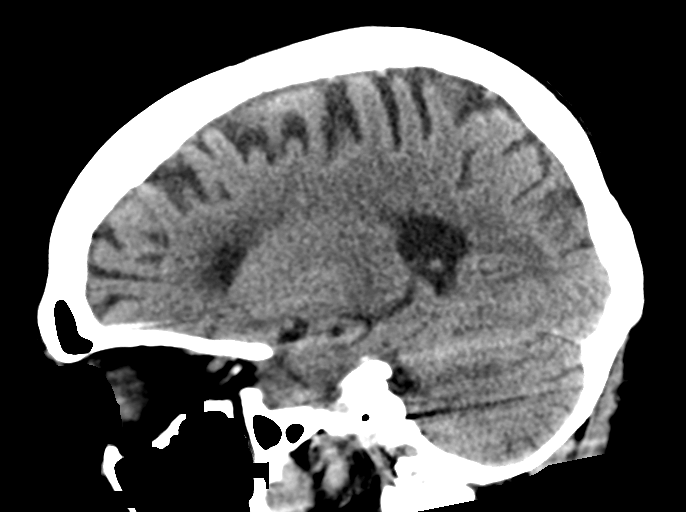

[15 of 47 positions shown; findings below may reference images not displayed]

FINDINGS: Brain:

Mild generalized cerebral atrophy.

Moderate-sized chronic cortical/subcortical right MCA territory
infarct within the posterior right frontal lobe, right parietal lobe
and right temporal stem.

Background mild patchy and ill-defined hypoattenuation within
cerebral white matter, nonspecific but compatible with chronic small
vessel ischemic disease.

There is no acute intracranial hemorrhage.

No acute demarcated cortical infarct.

No extra-axial fluid collection.

No evidence of intracranial mass.

No midline shift.

Vascular: No hyperdense vessel.  Atherosclerotic calcifications.

Skull: Normal. Negative for fracture or focal lesion.

Sinuses/Orbits: Visualized orbits show no acute finding. Small
mucous retention cysts within the partially imaged right maxillary
sinus.
IMPRESSION: No evidence of acute intracranial abnormality.

Redemonstrated moderate-sized chronic cortical/subcortical right MCA
territory infarct.

Stable background mild generalized cerebral atrophy and chronic
small vessel ischemic disease.
